# Patient Record
Sex: Male | Born: 1995 | Race: Black or African American | Hispanic: No | Marital: Single | Smoking: Never smoker
Health system: Southern US, Community
[De-identification: ages and names within clinical notes are randomized; demographics above are authoritative.]

## PROBLEM LIST (undated history)

## (undated) HISTORY — PX: ANKLE SURGERY: SHX546

---
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
## 2013-03-02 IMAGING — CR DG FINGER THUMB 2+V*R*
3 series · 3 of 3 positions shown · non-contrast
Comparison: None.

CLINICAL DATA: Right thumb pain after injury.

RIGHT THUMB 2+V

[x finger pa right]
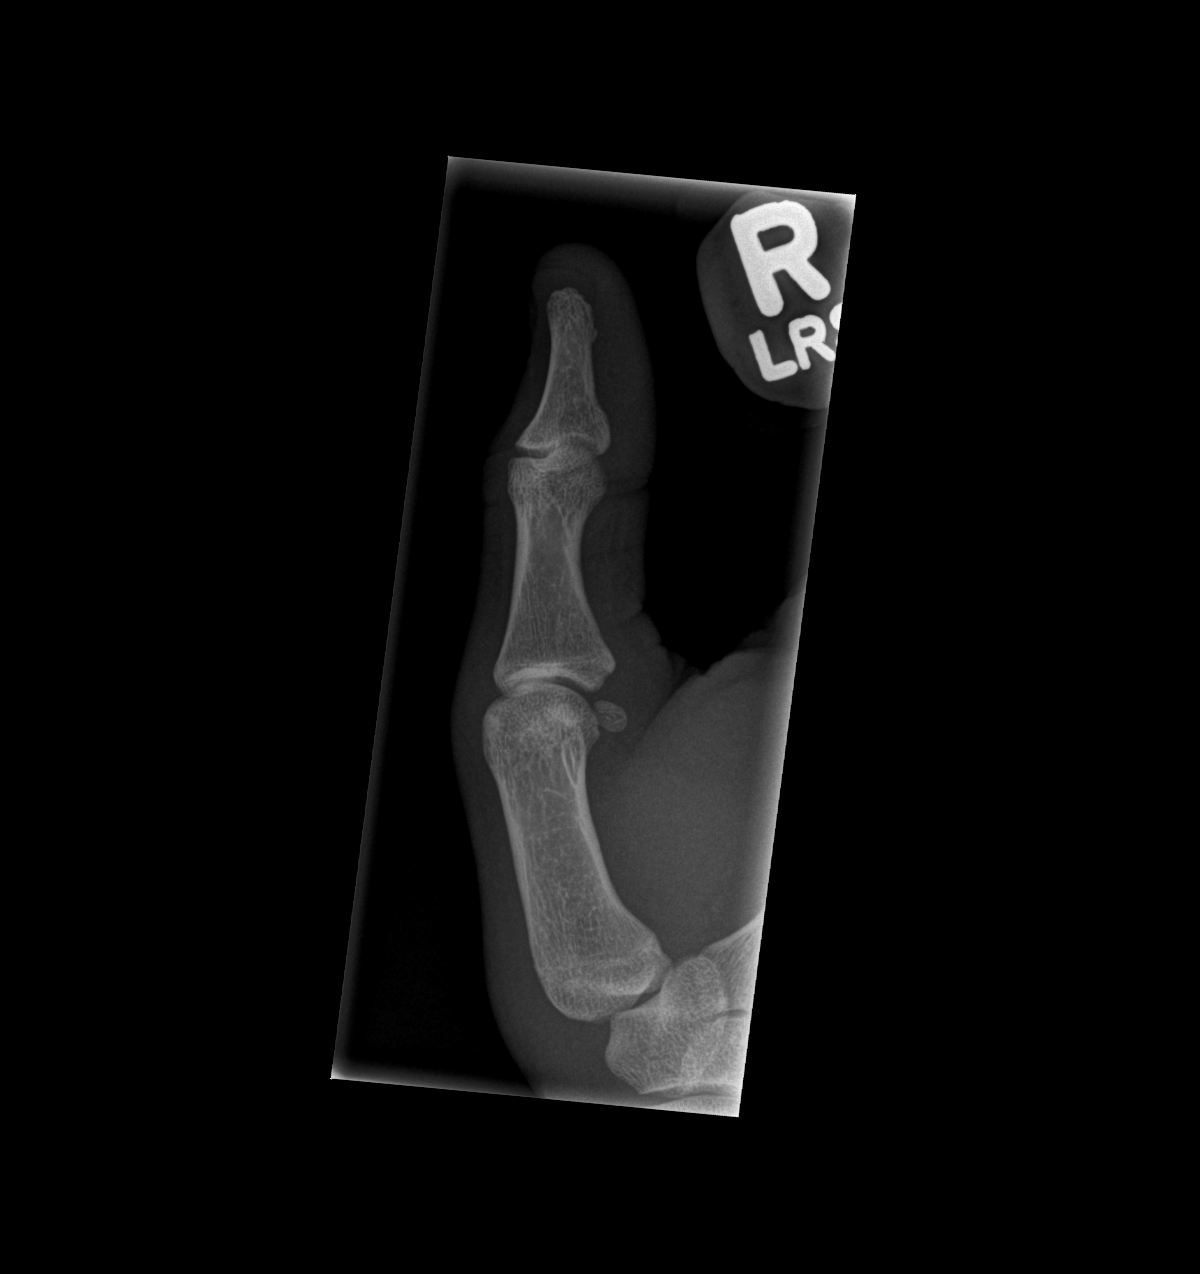

[x finger obl right]
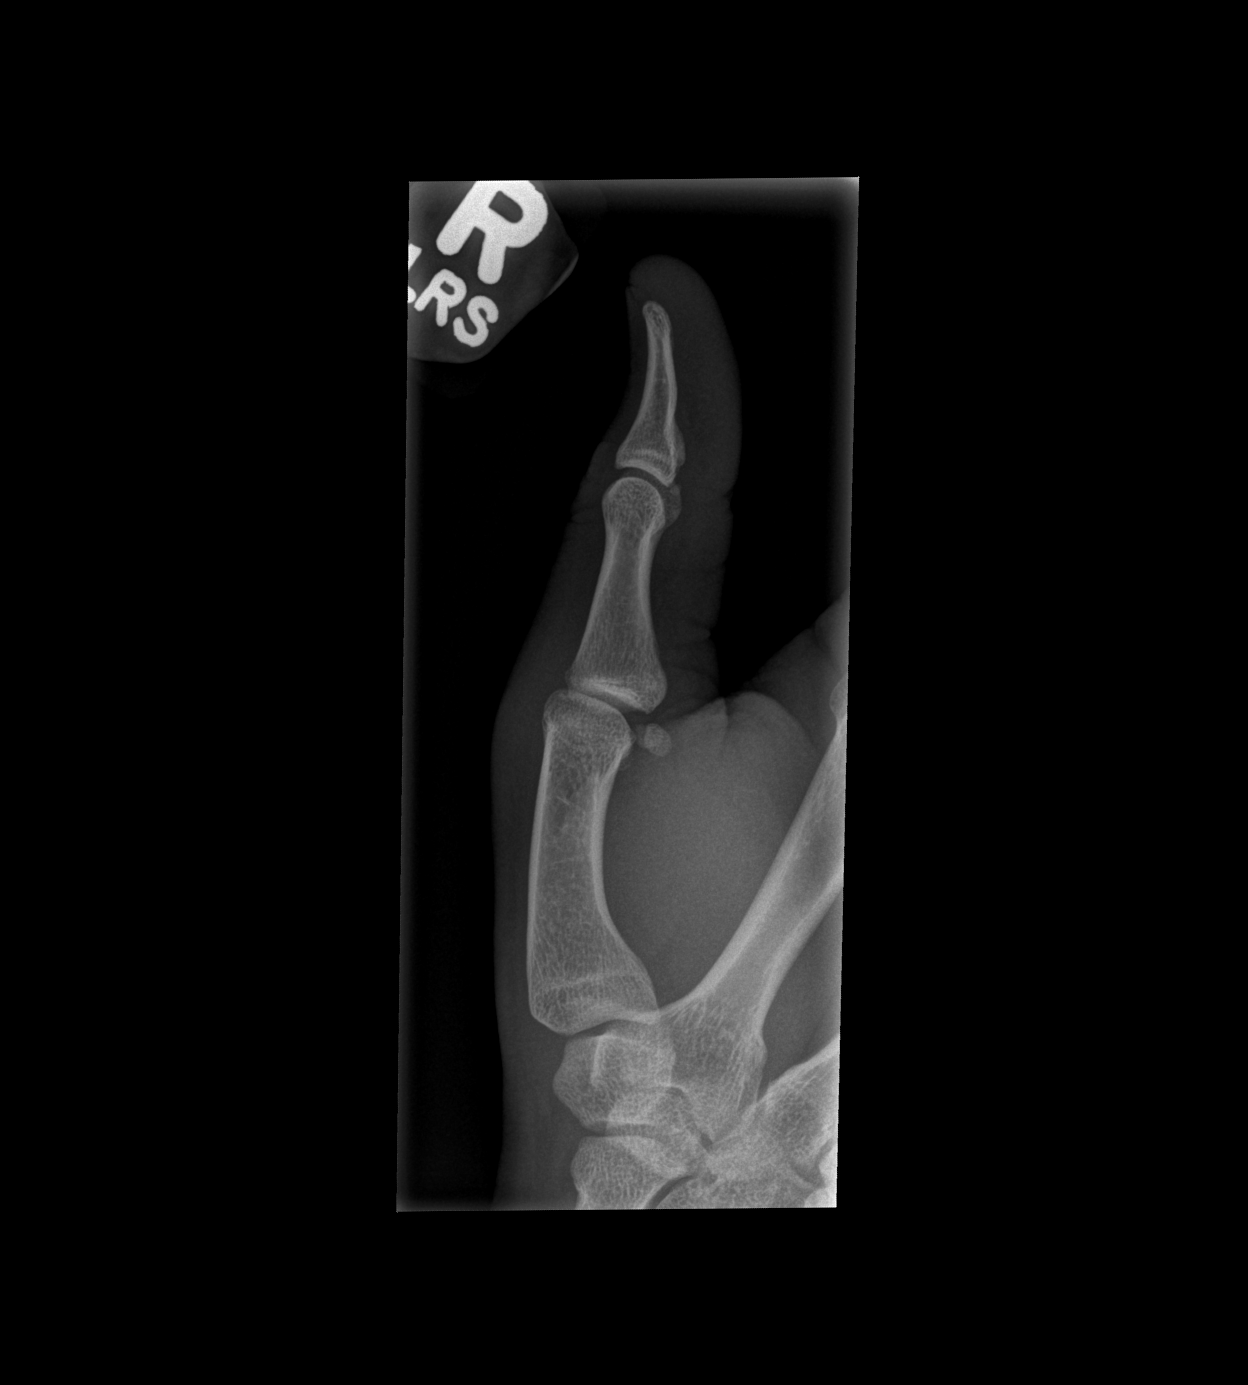

[x finger lat right]
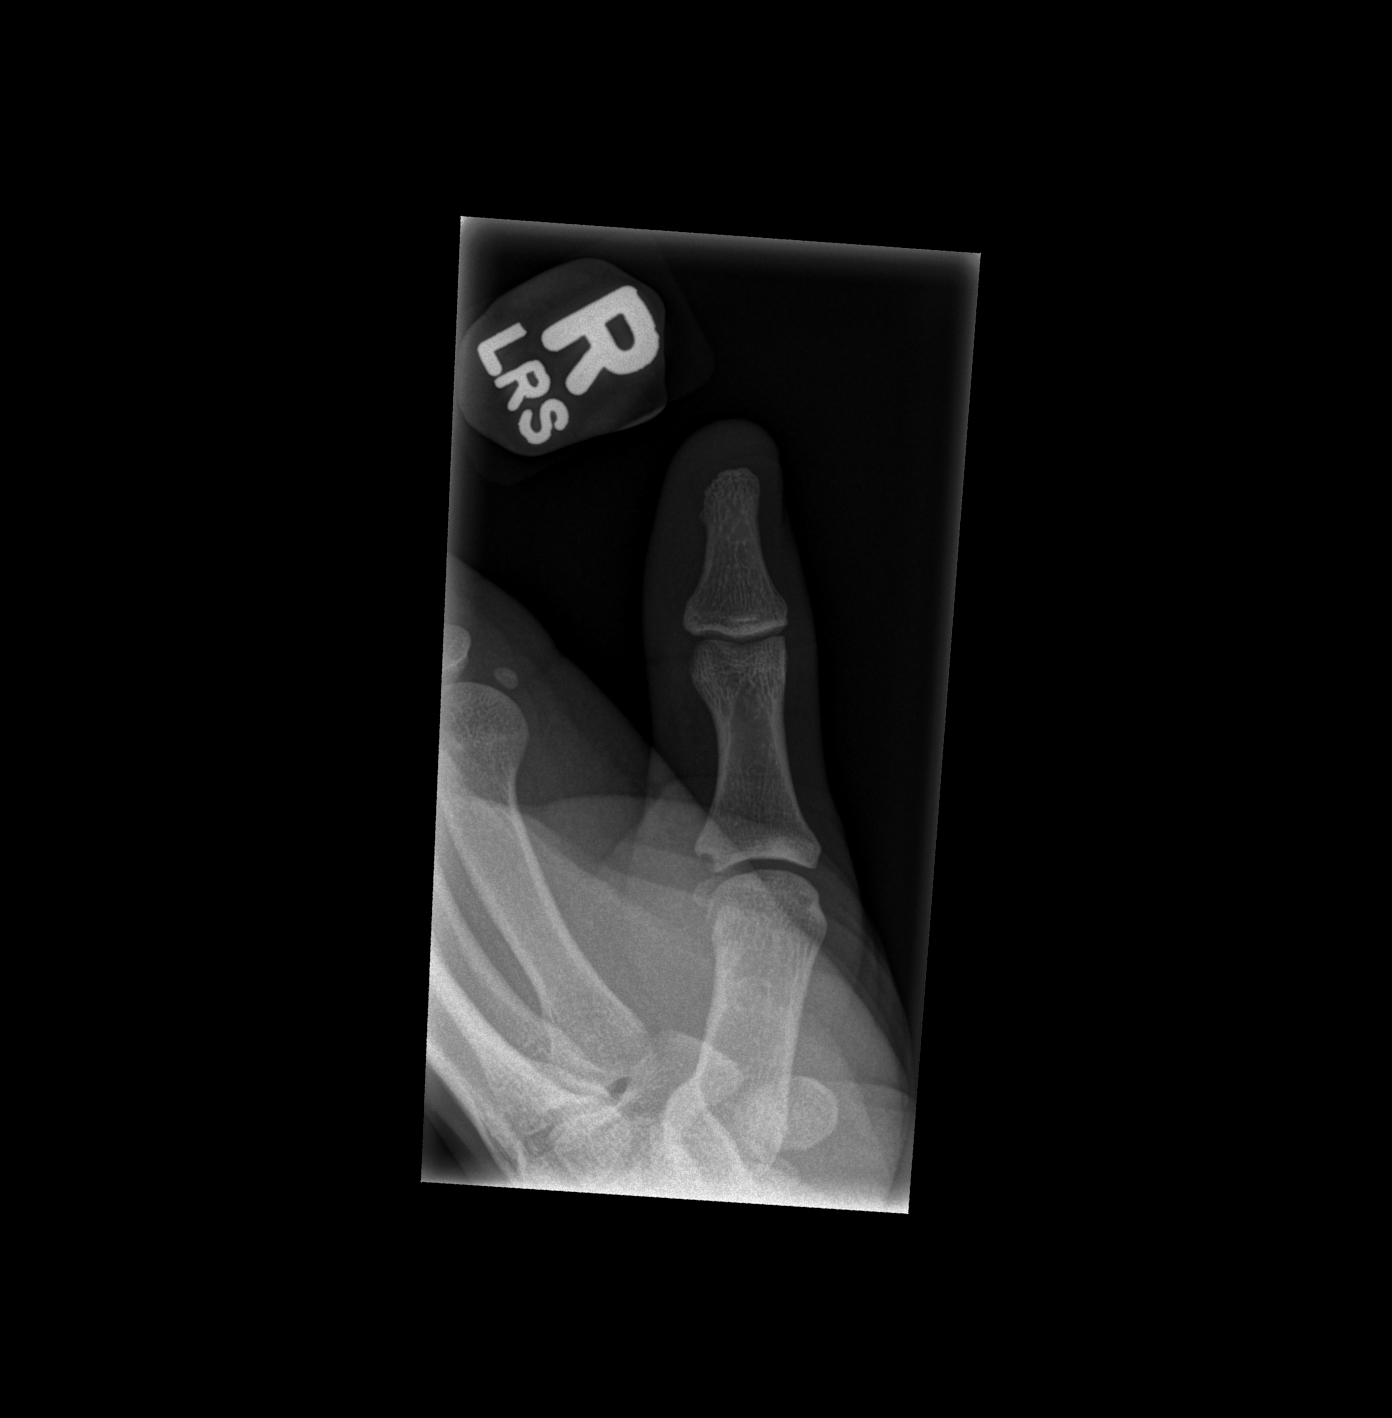

[3 of 3 positions shown; findings below may reference images not displayed]

FINDINGS: There is no evidence of fracture or dislocation.  The
right thumb appears intact.  Visualized joint spaces are preserved.
There is an apparent small nonspecific erosion at the medial aspect
of the base of the first proximal phalanx.  No significant soft
tissue abnormalities are characterized on radiograph.
IMPRESSION: 1.  No evidence of fracture or dislocation.
2.  Apparent small nonspecific erosion at the medial aspect of the
base of the first proximal phalanx.

## 2019-09-10 ENCOUNTER — Other Ambulatory Visit: Payer: Self-pay

## 2019-09-10 ENCOUNTER — Ambulatory Visit (HOSPITAL_COMMUNITY)
Admission: EM | Admit: 2019-09-10 | Discharge: 2019-09-10 | Disposition: A | Discharge status: Home / Self Care | Payer: Self-pay | Attending: Family Medicine | Admitting: Family Medicine

## 2019-09-10 ENCOUNTER — Encounter (HOSPITAL_COMMUNITY): Payer: Self-pay

## 2019-09-10 DIAGNOSIS — R369 Urethral discharge, unspecified: Secondary | ICD-10-CM | POA: Insufficient documentation

## 2019-09-10 MED ORDER — AZITHROMYCIN 250 MG PO TABS
ORAL_TABLET | ORAL | Status: AC
Start: 2019-09-10 — End: ?
  Filled 2019-09-10: qty 4

## 2019-09-10 MED ORDER — CEFTRIAXONE SODIUM 250 MG IJ SOLR
INTRAMUSCULAR | Status: AC
  Filled 2019-09-10: qty 250

## 2019-09-10 MED ORDER — CEFTRIAXONE SODIUM 250 MG IJ SOLR
250.0000 mg | Freq: Once | INTRAMUSCULAR | Status: AC
  Administered 2019-09-10: 250 mg via INTRAMUSCULAR

## 2019-09-10 MED ORDER — AZITHROMYCIN 250 MG PO TABS
1000.0000 mg | ORAL_TABLET | Freq: Once | ORAL | Status: AC
  Administered 2019-09-10: 1000 mg via ORAL

## 2019-09-10 NOTE — ED Provider Notes (Signed)
[facility]   [id number] 09/10/19 Arrival Time: 0908  ASSESSMENT & PLAN:  1. Penile discharge       Discharge Instructions     You have been given the following medications today for treatment of suspected gonorrhea and/or chlamydia:  cefTRIAXone (ROCEPHIN) injection 250 mg azithromycin (ZITHROMAX) tablet 1,000 mg  Even though we have treated you today, we have sent testing for sexually transmitted infections. We will notify you of any positive results once they are received. If required, we will prescribe any medications you might need.  Please refrain from all sexual activity for at least the next seven days.     Pending: Labs Reviewed  CYTOLOGY, (ORAL, ANAL, URETHRAL) ANCILLARY ONLY    Will notify of any positive results. Instructed to refrain from sexual activity for at least seven days.  Reviewed expectations re: course of current medical issues. Questions answered. Outlined signs and symptoms indicating need for more acute intervention. Patient verbalized understanding. After Visit Summary given.   SUBJECTIVE:  Deforrest Bogle is a 23 y.o. male who presents with complaint of penile discharge. Onset abrupt. First noticed a week ago. Describes discharge as thick and white/yellow. Denies: urinary frequency, hematuria, urinary hesitancy, urinary retention, chills and sweats. Afebrile. No abdominal or pelvic pain. No n/v. No rashes or lesions. Reports that he is sexually active with single male partner; without regular condom use. OTC treatment: none. History of STI: No.  ROS: As per HPI. All other systems negative.   OBJECTIVE:  Vitals:   09/10/19 0954  BP: 115/75  Pulse: (!) 57  Resp: 16  Temp: 98.4 F (36.9 C)  TempSrc: Temporal  SpO2: 100%     General appearance: alert, cooperative, appears stated age and no distress Throat: lips, mucosa, and tongue normal; teeth and gums normal CV: RRR Lungs: CTAB Back: no CVA tenderness; FROM at waist  Abdomen: soft, non-tender GU: normal appearing genitalia Skin: warm and dry Psychological: alert and cooperative; normal mood and affect.   Labs Reviewed  CYTOLOGY, (ORAL, ANAL, URETHRAL) ANCILLARY ONLY    No Known Allergies  PMH: "Healthy".  FH: Question of HTN.  Social History   Socioeconomic History  . Marital status: Single    Spouse name: Not on file  . Number of children: Not on file  . Years of education: Not on file  . Highest education level: Not on file  Occupational History  . Not on file  Social Needs  . Financial resource strain: Not on file  . Food insecurity    Worry: Not on file    Inability: Not on file  . Transportation needs    Medical: Not on file    Non-medical: Not on file  Tobacco Use  . Smoking status: Never Smoker  . Smokeless tobacco: Never Used  Substance and Sexual Activity  . Alcohol use: No  . Drug use: No  . Sexual activity: Not on file  Lifestyle  . Physical activity    Days per week: Not on file    Minutes per session: Not on file  . Stress: Not on file  Relationships  . Social Herbalist on phone: Not on file    Gets together: Not on file    Attends religious service: Not on file    Active member of club or organization: Not on file    Attends meetings of clubs or organizations: Not on file    Relationship status: Not on file  . Intimate partner violence  Fear of current or ex partner: Not on file    Emotionally abused: Not on file    Physically abused: Not on file    Forced sexual activity: Not on file  Other Topics Concern  . Not on file  Social History Narrative  . Not on file          Mardella Layman, MD 09/10/19 1009

## 2019-09-10 NOTE — ED Triage Notes (Signed)
Pt presents for STD testing with complaints of penile discharge and burning in private area for past few days.

## 2019-09-10 NOTE — Discharge Instructions (Signed)

## 2019-09-11 LAB — CYTOLOGY, (ORAL, ANAL, URETHRAL) ANCILLARY ONLY
Chlamydia: POSITIVE — AB
Neisseria Gonorrhea: POSITIVE — AB
Trichomonas: NEGATIVE

## 2019-09-13 ENCOUNTER — Telehealth (HOSPITAL_COMMUNITY): Payer: Self-pay | Admitting: Emergency Medicine

## 2019-09-13 NOTE — Telephone Encounter (Signed)
Chlamydia is positive.  This was treated at the urgent care visit with po zithromax 1g.  Pt needs education to please refrain from sexual intercourse for 7 days to give the medicine time to work.  Sexual partners need to be notified and tested/treated.  Condoms may reduce risk of reinfection.  Recheck or followup with PCP for further evaluation if symptoms are not improving.  [facility] notified.  Test for gonorrhea was positive. This was treated at the urgent care visit with IM rocephin 250mg and po zithromax 1g. Pt needs education to refrain from sexual intercourse for 7 days after treatment to give the medicine time to work. Sexual partners need to be notified and tested/treated. Condoms may reduce risk of reinfection. Recheck or followup with PCP for further evaluation if symptoms are not improving. [facility] notified.   Patient contacted and made aware of    results, all questions answered    

## 2020-06-23 ENCOUNTER — Other Ambulatory Visit: Payer: Self-pay

## 2020-06-23 ENCOUNTER — Encounter (HOSPITAL_COMMUNITY): Payer: Self-pay

## 2020-06-23 ENCOUNTER — Ambulatory Visit (HOSPITAL_COMMUNITY)
Admission: EM | Admit: 2020-06-23 | Discharge: 2020-06-23 | Disposition: A | Discharge status: Home / Self Care | Payer: Self-pay | Attending: Family Medicine | Admitting: Family Medicine

## 2020-06-23 DIAGNOSIS — G5622 Lesion of ulnar nerve, left upper limb: Secondary | ICD-10-CM

## 2020-06-23 MED ORDER — METHYLPREDNISOLONE 4 MG PO TBPK: ORAL_TABLET | ORAL | 0 refills | Status: AC

## 2020-06-23 NOTE — ED Triage Notes (Signed)
Pt is here with left hand numbness that started 3 days ago, pt has not taken anything to relieve discomfort.

## 2020-06-23 NOTE — Discharge Instructions (Signed)
Take the medrol pak as directed Take all of day one today Obtain follow up if this progresses/worsens

## 2020-06-23 NOTE — ED Triage Notes (Signed)
Patient in today w/ c/o numbness in the 4th and 5th digits of left hand which radiates into his forearm. Patient also states he just returned from [place] 2 days ago. Patient denies injury to the extremity and loss of use of that hand and arm. Patient has not taken anything to relieve discomfort.

## 2020-06-23 NOTE — ED Provider Notes (Signed)
[facility]    CSN: [id number] Arrival date & time: 06/23/20  1026      History   Chief Complaint Chief Complaint  Patient presents with  . Numbness    HPI Drew Miller is a 24 y.o. male.   HPI  Patient is here for numbness in his left hand.  He states that he was on a very long flight from [place] and fell asleep in an awkward position with his elbow on the armrest.  When he woke up he had numbness in the fourth and fifth fingers of his left hand.  This is persisted for 3 days.  He is not taking any medication.  He states that he can feel the tips of his fingers but they feel diminished as opposed to the other fingers.  He also gets a tingling and prickling sensation in his in his fingers.  He has normal strength and use of his hand.  Normal dexterity.  No pain in his neck.  History reviewed. No pertinent past medical history.  Patient Active Problem List   Diagnosis Date Noted  . BACK PAIN, LUMBAR, CHRONIC 11/12/2009  . PES PLANUS 10/22/2009  . COMMON MIGRAINE 06/09/2009  . HERPES LABIALIS 01/07/2009  . 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 ANKLE SURGERY         Home Medications    Prior to Admission medications   Medication Sig Start Date End Date Taking? Authorizing Provider  aspirin 81 MG chewable tablet Chew by mouth. 10/18/15  Yes [provider]  methylPREDNISolone (MEDROL DOSEPAK) 4 MG TBPK tablet TAD 06/23/20   Eustace Moore, MD  SUMAtriptan (IMITREX) 50 MG tablet Take 0.5 tablets (25 mg total) by mouth once as needed for migraine. 07/30/11 07/29/12  Floydene Flock, MD    Family History Family History  Problem Relation Age of Onset  . Healthy Mother   . Healthy Father     Social History Social History   Tobacco Use  . Smoking status: Never Smoker  . Smokeless tobacco: Never Used  Substance Use Topics  . Alcohol use: Yes  . Drug use: No     Allergies   Patient  has no known allergies.   Review of Systems Review of Systems See HPI  Physical Exam Triage Vital Signs ED Triage Vitals  Enc Vitals Group     BP 06/23/20 1127 (!) 129/92     Pulse Rate 06/23/20 1127 60     Resp 06/23/20 1127 18     Temp 06/23/20 1127 98 F (36.7 C)     Temp Source 06/23/20 1127 Oral     SpO2 06/23/20 1127 100 %     Weight 06/23/20 1125 (!) 205 lb (93 kg)     Height 06/23/20 1141 6\' 2"  (1.88 m)     Head Circumference --      Peak Flow --      Pain Score 06/23/20 1125 0     Pain Loc --      Pain Edu? --      Excl. in GC? --    No data found.  Updated Vital Signs BP (!) 129/92 (BP Location: Left Arm)   Pulse 60   Temp 98 F (36.7 C) (Oral)   Resp 18   Ht 6\' 2"  (1.88 m)   Wt (!) 93 kg   SpO2 100%   BMI 26.32 kg/m     Physical Exam  Constitutional:      General: He is not in acute distress.    Appearance: He is well-developed and normal weight.  HENT:     Head: Normocephalic and atraumatic.     Mouth/Throat:     Comments: Mask is in place Eyes:     Conjunctiva/sclera: Conjunctivae normal.     Pupils: Pupils are equal, round, and reactive to light.  Cardiovascular:     Rate and Rhythm: Normal rate.  Pulmonary:     Effort: Pulmonary effort is normal. No respiratory distress.  Abdominal:     General: There is no distension.  Musculoskeletal:        General: Normal range of motion.     Left elbow: Tenderness present.       Arms:     Cervical back: Normal range of motion.  Skin:    General: Skin is warm and dry.  Neurological:     Mental Status: He is alert.  Psychiatric:        Mood and Affect: Mood normal.        Behavior: Behavior normal.      UC Treatments / Results  Labs (all labs ordered are listed, but only abnormal results are displayed) Labs Reviewed - No data to display  EKG   Radiology No results found.  Procedures Procedures (including critical care time)  Medications Ordered in UC Medications - No data to  display  Initial Impression / Assessment and Plan / UC Course  I have reviewed the triage vital signs and the nursing notes.  Pertinent labs & imaging results that were available during my care of the patient were reviewed by me and considered in my medical decision making (see chart for details).     Discussed with patient that he must put pressure on the nerve at the elbow while he was sleeping.  The nerve is inflamed, but not likely permanently damage.  Expect slow resolution. Final Clinical Impressions(s) / UC Diagnoses   Final diagnoses:  Ulnar neuropathy at elbow of left upper extremity     Discharge Instructions     Take the medrol pak as directed Take all of day one today Obtain follow up if this progresses/worsens   ED Prescriptions    Medication Sig Dispense Auth. Provider   methylPREDNISolone (MEDROL DOSEPAK) 4 MG TBPK tablet TAD 21 tablet Eustace Moore, MD     PDMP not reviewed this encounter.   Eustace Moore, MD 06/23/20 [phone]
